# Patient Record
Sex: Male | Born: 2000 | Race: White | Hispanic: No | Marital: Single | Smoking: Never smoker
Health system: Southern US, Community
[De-identification: ages and names within clinical notes are randomized; demographics above are authoritative.]

## PROBLEM LIST (undated history)

## (undated) DIAGNOSIS — F88 Other disorders of psychological development: Secondary | ICD-10-CM

## (undated) DIAGNOSIS — M419 Scoliosis, unspecified: Secondary | ICD-10-CM

## (undated) DIAGNOSIS — F909 Attention-deficit hyperactivity disorder, unspecified type: Secondary | ICD-10-CM

---
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
## 2019-11-06 ENCOUNTER — Other Ambulatory Visit: Payer: Self-pay

## 2019-11-06 ENCOUNTER — Ambulatory Visit
Admission: EM | Admit: 2019-11-06 | Discharge: 2019-11-06 | Disposition: A | Discharge status: Home / Self Care | Payer: Medicaid Other | Attending: Family Medicine | Admitting: Family Medicine

## 2019-11-06 ENCOUNTER — Encounter: Payer: Self-pay | Admitting: Emergency Medicine

## 2019-11-06 DIAGNOSIS — S81812A Laceration without foreign body, left lower leg, initial encounter: Secondary | ICD-10-CM | POA: Diagnosis not present

## 2019-11-06 NOTE — Discharge Instructions (Signed)
You were seen at the Urgent Care for a leg laceration. Take Tylenol or Ibuprofen for pain as needed. Return to urgent care or see your PCP in 7-10 days to have sutures removed.   If you haven't already, sign up for My Chart to have easy access to your labs results, and communication with your primary care physician.  Dr. Rachael Darby

## 2019-11-06 NOTE — ED Provider Notes (Addendum)
[facility]    CSN: [id number] Arrival date & time: 11/06/19  1906      History   Chief Complaint Chief Complaint  Patient presents with  . Laceration    HPI Don Douglas is a 19 y.o. male.   HPI  Patient states he was working with his dad outside and was standing on a pile of truss and his leg when through and was caught on metal. Patient with right leg with lacerations and bleeding. Endorses right leg pain.  Mom wrapped the leg in a towel and secured it with a bungee cord and brought the patient to the ED.    Past Medical History:  Diagnosis Date  . ADHD (attention deficit hyperactivity disorder)   . Sensory integration disorder     There are no problems to display for this patient.   History reviewed. No pertinent surgical history.     Home Medications    Prior to Admission medications   Medication Sig Start Date End Date Taking? Authorizing Provider  methylphenidate (RITALIN) 10 MG tablet Take 10 mg by mouth 2 (two) times daily.    [provider]  montelukast (SINGULAIR) 10 MG tablet Take 10 mg by mouth at bedtime.    [provider]    Family History Family History  Problem Relation Age of Onset  . Healthy Mother   . Healthy Father     Social History Social History   Tobacco Use  . Smoking status: Never Smoker  . Smokeless tobacco: Never Used  Vaping Use  . Vaping Use: Never used  Substance Use Topics  . Alcohol use: No  . Drug use: Not Currently     Allergies   Penicillins   Review of Systems Review of Systems  Gastrointestinal: Negative for nausea and vomiting.  Musculoskeletal:       Leg pain  Skin: Positive for wound.  Neurological: Negative for weakness, light-headedness, numbness and headaches.  All other systems reviewed and are negative.    Physical Exam Triage Vital Signs ED Triage Vitals  Enc Vitals Group     BP 11/06/19 2002 121/77     Pulse Rate 11/06/19 2002 65     Resp 11/06/19  2002 18     Temp 11/06/19 2002 98.4 F (36.9 C)     Temp Source 11/06/19 2002 Oral     SpO2 11/06/19 2002 100 %     Weight 11/06/19 2000 149 lb 14.6 oz (68 kg)     Height 11/06/19 2000 5\' 10"  (1.778 m)     Head Circumference --      Peak Flow --      Pain Score 11/06/19 2000 3     Pain Loc --      Pain Edu? --      Excl. in GC? --    No data found.  Updated Vital Signs BP 121/77 (BP Location: Left Arm)   Pulse 65   Temp 98.4 F (36.9 C) (Oral)   Resp 18   Ht 5\' 10"  (1.778 m)   Wt 149 lb 14.6 oz (68 kg)   SpO2 100%   BMI 21.51 kg/m   Visual Acuity Right Eye Distance:   Left Eye Distance:   Bilateral Distance:    Right Eye Near:   Left Eye Near:    Bilateral Near:     Physical Exam Vitals and nursing note reviewed.  Constitutional:      Appearance: Normal appearance.  HENT:  Head: Normocephalic and atraumatic.     Nose: Nose normal.  Eyes:     Extraocular Movements: Extraocular movements intact.     Conjunctiva/sclera: Conjunctivae normal.  Cardiovascular:     Rate and Rhythm: Normal rate and regular rhythm.     Pulses: Normal pulses.  Pulmonary:     Effort: Pulmonary effort is normal. No respiratory distress.  Musculoskeletal:        General: Tenderness and signs of injury present. Normal range of motion.     Cervical back: Normal range of motion.  Skin:    General: Skin is warm.     Capillary Refill: Capillary refill takes less than 2 seconds.  Neurological:     General: No focal deficit present.     Mental Status: He is alert and oriented to person, place, and time. Mental status is at baseline.     Sensory: No sensory deficit.     Coordination: Coordination normal.  Psychiatric:        Mood and Affect: Mood normal.        Behavior: Behavior normal.      UC Treatments / Results  Labs (all labs ordered are listed, but only abnormal results are displayed) Labs Reviewed - No data to display  EKG   Radiology No results  found.  Procedures Laceration Repair  Date/Time: 11/06/2019 9:38 PM Performed by: Katha Cabal, DO Authorized by: Tommie Sams, DO   Consent:    Consent obtained:  Verbal   Consent given by:  Patient   Risks discussed:  Infection, pain, retained foreign body, poor cosmetic result and poor wound healing   Alternatives discussed:  Delayed treatment Anesthesia (see MAR for exact dosages):    Anesthesia method:  Local infiltration   Local anesthetic:  Lidocaine 1% WITH epi Laceration details:    Location:  Leg   Wound length (cm): medial lac: 4.5 cm, lateral lac 2.5 cm. Repair type:    Repair type:  Simple Pre-procedure details:    Preparation:  Patient was prepped and draped in usual sterile fashion Exploration:    Hemostasis achieved with:  Epinephrine   Wound exploration: entire depth of wound probed and visualized     Wound extent: no fascia violation noted, no foreign bodies/material noted, no muscle damage noted, no nerve damage noted, no tendon damage noted, no underlying fracture noted and no vascular damage noted   Treatment:    Area cleansed with:  Betadine   Amount of cleaning:  Standard   Irrigation solution:  Sterile water   Irrigation method:  Syringe Skin repair:    Repair method:  Sutures   Suture size:  3-0   Suture material:  Prolene   Suture technique:  Simple interrupted Approximation:    Approximation:  Close Post-procedure details:    Dressing:  Antibiotic ointment and non-adherent dressing   (including critical care time)  Medications Ordered in UC Medications - No data to display  Initial Impression / Assessment and Plan / UC Course  I have reviewed the triage vital signs and the nursing notes.  Pertinent labs & imaging results that were available during my care of the patient were reviewed by me and considered in my medical decision making (see chart for details).     Laceration repaired. Patient to return in 7-10 days for suture removal.  Tetanus 12/2017.   Final Clinical Impressions(s) / UC Diagnoses   Final diagnoses:  Laceration of left lower extremity, initial encounter     Discharge  Instructions     You were seen at the Urgent Care for a leg laceration. Take Tylenol or Ibuprofen for pain as needed. Return to urgent care or see your PCP in 7-10 days to have sutures removed.   If you haven't already, sign up for My Chart to have easy access to your labs results, and communication with your primary care physician.  Dr. Rachael Darby      ED Prescriptions    None     PDMP not reviewed this encounter.   Katha Cabal, DO 11/06/19 2149    Katha Cabal, DO 11/06/19 2152

## 2019-11-06 NOTE — ED Triage Notes (Signed)
Pt has a laceration on the right lower calf. He states he was working with his dad and cut his leg on a truss. This occurred about 6 pm today. Last tetanus vaccine was 12/13/2017.

## 2019-11-18 ENCOUNTER — Ambulatory Visit
Admission: EM | Admit: 2019-11-18 | Discharge: 2019-11-18 | Disposition: A | Discharge status: Home / Self Care | Payer: Medicaid Other

## 2019-11-18 DIAGNOSIS — Z4802 Encounter for removal of sutures: Secondary | ICD-10-CM

## 2019-11-18 NOTE — ED Triage Notes (Signed)
Patient in today for suture removal- right calf.

## 2020-04-11 DIAGNOSIS — R002 Palpitations: Secondary | ICD-10-CM | POA: Insufficient documentation

## 2020-04-11 DIAGNOSIS — F419 Anxiety disorder, unspecified: Secondary | ICD-10-CM | POA: Insufficient documentation

## 2020-04-11 DIAGNOSIS — L700 Acne vulgaris: Secondary | ICD-10-CM | POA: Insufficient documentation

## 2020-04-11 DIAGNOSIS — Z0000 Encounter for general adult medical examination without abnormal findings: Secondary | ICD-10-CM | POA: Insufficient documentation

## 2020-09-28 ENCOUNTER — Encounter: Payer: Self-pay | Admitting: Sports Medicine

## 2020-09-28 ENCOUNTER — Other Ambulatory Visit: Payer: Self-pay

## 2020-09-28 ENCOUNTER — Ambulatory Visit
Admission: EM | Admit: 2020-09-28 | Discharge: 2020-09-28 | Disposition: A | Discharge status: Home / Self Care | Payer: Medicaid Other | Attending: Sports Medicine | Admitting: Sports Medicine

## 2020-09-28 DIAGNOSIS — Z8616 Personal history of COVID-19: Secondary | ICD-10-CM | POA: Insufficient documentation

## 2020-09-28 DIAGNOSIS — R110 Nausea: Secondary | ICD-10-CM | POA: Diagnosis not present

## 2020-09-28 DIAGNOSIS — Z880 Allergy status to penicillin: Secondary | ICD-10-CM | POA: Insufficient documentation

## 2020-09-28 DIAGNOSIS — Z20822 Contact with and (suspected) exposure to covid-19: Secondary | ICD-10-CM | POA: Insufficient documentation

## 2020-09-28 DIAGNOSIS — B349 Viral infection, unspecified: Secondary | ICD-10-CM | POA: Diagnosis not present

## 2020-09-28 DIAGNOSIS — J029 Acute pharyngitis, unspecified: Secondary | ICD-10-CM

## 2020-09-28 DIAGNOSIS — Z79899 Other long term (current) drug therapy: Secondary | ICD-10-CM | POA: Insufficient documentation

## 2020-09-28 DIAGNOSIS — R61 Generalized hyperhidrosis: Secondary | ICD-10-CM

## 2020-09-28 DIAGNOSIS — R52 Pain, unspecified: Secondary | ICD-10-CM | POA: Diagnosis not present

## 2020-09-28 DIAGNOSIS — J358 Other chronic diseases of tonsils and adenoids: Secondary | ICD-10-CM

## 2020-09-28 DIAGNOSIS — R519 Headache, unspecified: Secondary | ICD-10-CM | POA: Insufficient documentation

## 2020-09-28 LAB — GROUP A STREP BY PCR: Group A Strep by PCR: NOT DETECTED

## 2020-09-28 LAB — SARS CORONAVIRUS 2 (TAT 6-24 HRS): SARS Coronavirus 2: NEGATIVE

## 2020-09-28 MED ORDER — ONDANSETRON HCL 4 MG PO TABS: 4.0000 mg | ORAL_TABLET | Freq: Four times a day (QID) | ORAL | 0 refills | Status: DC

## 2020-09-28 NOTE — Discharge Instructions (Addendum)
As we discussed, your strep test was negative. Your COVID test was pending at the time of discharge. Supportive care, over-the-counter meds as needed, Tylenol or Motrin for any fever or discomfort.  Plenty of rest and plenty of fluids. I prescribed you a medicine for your nausea. Please follow-up with your primary care provider if your symptoms persist. Please go to the emergency room if your symptoms worsen. I gave you a work note keep you out today but she can go back to work tomorrow if your COVID test is negative.  You can follow along with the app on your phone called MyChart.  Instructions are on your discharge summary.  If you are positive someone will contact you and give you an updated work note.  They may not always contact you if you are negative.

## 2020-09-28 NOTE — ED Triage Notes (Signed)
Pt c/o sore throat since yesterday. Pt also reports body aches, nausea and headache. Pt would like to be tested for strep. Pt denies f/v/d or other s/s.

## 2020-09-28 NOTE — ED Provider Notes (Signed)
[facility]    CSN: [id number] Arrival date & time: 09/28/20  1005      History   Chief Complaint Chief Complaint  Patient presents with   Sore Throat   Generalized Body Aches   Headache    HPI Don Douglas is a 20 y.o. male.   20 year old male who presents for evaluation of URI symptoms.  He reports that yesterday began having a sore throat, body aches, and headache.  He has had no COVID exposure.  He has had COVID back in 2020.  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  No fever shakes chills.  Has not taken any medicine and has not done any treatment.  No cough rhinorrhea or nasal or chest congestion.  No fatigue.  No abdominal or urinary symptoms.  He has a little bit of nausea and has been sweating a little bit more.  No vomiting or diarrhea.  He looked in his throat this morning and noticed some white spots in the back of his throat and was concerned about strep throat.  He denies chest pain shortness of breath.  No red flag signs or symptoms elicited on history.    Past Medical History:  Diagnosis Date   ADHD (attention deficit hyperactivity disorder)    Sensory integration disorder     There are no problems to display for this patient.   History reviewed. No pertinent surgical history.     Home Medications    Prior to Admission medications   Medication Sig Start Date End Date Taking? Authorizing Provider  clindamycin (CLEOCIN T) 1 % external solution Apply topically 2 (two) times daily. 05/31/20  Yes [provider]  hydrOXYzine (ATARAX/VISTARIL) 25 MG tablet Take 25 mg by mouth 3 (three) times daily. 05/31/20  Yes [provider]  ondansetron (ZOFRAN) 4 MG tablet Take 1 tablet (4 mg total) by mouth every 6 (six) hours. 09/28/20  Yes Delton See, MD  methylphenidate (RITALIN) 10 MG tablet Take 10 mg by mouth 2 (two) times daily.    [provider]  montelukast (SINGULAIR) 10 MG tablet Take 10  mg by mouth at bedtime.    [provider]    Family History Family History  Problem Relation Age of Onset   Healthy Mother    Healthy Father     Social History Social History   Tobacco Use   Smoking status: Never   Smokeless tobacco: Never  Vaping Use   Vaping Use: Never used  Substance Use Topics   Alcohol use: No   Drug use: Not Currently     Allergies   Penicillins   Review of Systems Review of Systems  Constitutional:  Negative for activity change, appetite change, chills, diaphoresis, fatigue and fever.  HENT:  Positive for sore throat. Negative for congestion, ear pain, postnasal drip, rhinorrhea, sinus pressure, sinus pain, sneezing and trouble swallowing.   Eyes:  Negative for pain.  Respiratory:  Negative for cough, chest tightness and shortness of breath.   Cardiovascular:  Negative for chest pain and palpitations.  Gastrointestinal:  Negative for abdominal pain, diarrhea, nausea and vomiting.  Genitourinary:  Negative for dysuria.  Musculoskeletal:  Positive for myalgias. Negative for back pain and neck pain.  Skin:  Negative for color change, pallor, rash and wound.  Neurological:  Positive for headaches. Negative for dizziness, light-headedness and numbness.  All other systems reviewed and are negative.   Physical Exam Triage Vital Signs  ED Triage Vitals [09/28/20 1117]  Enc Vitals Group     BP      Pulse      Resp      Temp      Temp src      SpO2      Weight 150 lb (68 kg)     Height 6\' 3"  (1.905 m)     Head Circumference      Peak Flow      Pain Score 10     Pain Loc      Pain Edu?      Excl. in GC?    No data found.  Updated Vital Signs BP 112/82 (BP Location: Left Arm)   Pulse 74   Temp 98.7 F (37.1 C) (Oral)   Resp 18   Ht 6\' 3"  (1.905 m)   Wt 68 kg   SpO2 100%   BMI 18.75 kg/m   Visual Acuity Right Eye Distance:   Left Eye Distance:   Bilateral Distance:    Right Eye Near:   Left Eye Near:    Bilateral  Near:     Physical Exam Vitals and nursing note reviewed.  Constitutional:      General: He is not in acute distress.    Appearance: Normal appearance. He is well-developed. He is not ill-appearing, toxic-appearing or diaphoretic.  HENT:     Head: Normocephalic and atraumatic.     Right Ear: Tympanic membrane normal.     Left Ear: Tympanic membrane normal.     Nose: Nose normal. No congestion or rhinorrhea.     Mouth/Throat:     Mouth: Mucous membranes are moist.     Pharynx: Uvula midline. Oropharyngeal exudate and posterior oropharyngeal erythema present. No pharyngeal swelling or uvula swelling.     Tonsils: Tonsillar exudate present. No tonsillar abscesses. 2+ on the right. 2+ on the left.  Eyes:     Conjunctiva/sclera: Conjunctivae normal.     Pupils: Pupils are equal, round, and reactive to light.  Neck:     Thyroid: No thyromegaly.  Cardiovascular:     Rate and Rhythm: Normal rate and regular rhythm.     Pulses: Normal pulses.     Heart sounds: Normal heart sounds. No murmur heard.   No friction rub. No gallop.  Pulmonary:     Effort: Pulmonary effort is normal.     Breath sounds: Normal breath sounds. No stridor. No wheezing, rhonchi or rales.  Musculoskeletal:     Cervical back: Normal range of motion and neck supple.  Lymphadenopathy:     Cervical: Cervical adenopathy present.  Skin:    General: Skin is warm and dry.     Capillary Refill: Capillary refill takes less than 2 seconds.  Neurological:     General: No focal deficit present.     Mental Status: He is alert and oriented to person, place, and time.     UC Treatments / Results  Labs (all labs ordered are listed, but only abnormal results are displayed) Labs Reviewed  GROUP A STREP BY PCR  SARS CORONAVIRUS 2 (TAT 6-24 HRS)    EKG   Radiology No results found.  Procedures Procedures (including critical care time)  Medications Ordered in UC Medications - No data to display  Initial Impression  / Assessment and Plan / UC Course  I have reviewed the triage vital signs and the nursing notes.  Pertinent labs & imaging results that were available during my care of the  patient were reviewed by me and considered in my medical decision making (see chart for details).  Clinical impression: 1.  Sore throat 2.  Tonsillar exudate 3.  Nausea without vomiting 4.  Headache due to viral illness 5.  Body aches and diaphoresis  Treatment plan: 1.  The findings and treatment plan were discussed in detail with the patient.  Patient was in agreement. 2.  Recommend getting a strep test that was negative. 3.  Recommended getting a COVID test.  It was sent to the hospital.  Informed the patient that while he was waiting on the test he needed to isolate.  Should have the test results back later today.  If he is positive someone will contact him and transition him into isolation per the current CDC guidelines.  Encouraged him to follow along with the app on his phone called MyChart. 4.  Provided a work note keep him out today and that can be updated if necessary. 5.  Plenty of rest, plenty fluids, Tylenol or Motrin for any fever or discomfort. 6.  Did prescribe some Zofran for his nausea. 7.  While he had a little bit of tonsillar hypertrophy I did not prescribe steroids.  We did discuss doing a monotest but he is not having any fever, fatigue, or abdominal pain.  We will hold on that for now.  May be something we need to look at in the future. 8.  If symptoms persist he should see his primary care provider. 9.  If it worsens in any way he should go to the ER. 10.  He was discharged in stable condition will follow-up here as needed.    Final Clinical Impressions(s) / UC Diagnoses   Final diagnoses:  Pharyngitis, unspecified etiology  Body aches  Headache due to viral infection  Diaphoresis  Nausea without vomiting  Tonsillar exudate     Discharge Instructions      As we discussed, your strep  test was negative. Your COVID test was pending at the time of discharge. Supportive care, over-the-counter meds as needed, Tylenol or Motrin for any fever or discomfort.  Plenty of rest and plenty of fluids. I prescribed you a medicine for your nausea. Please follow-up with your primary care provider if your symptoms persist. Please go to the emergency room if your symptoms worsen. I gave you a work note keep you out today but she can go back to work tomorrow if your COVID test is negative.  You can follow along with the app on your phone called MyChart.  Instructions are on your discharge summary.  If you are positive someone will contact you and give you an updated work note.  They may not always contact you if you are negative.     ED Prescriptions     Medication Sig Dispense Auth. Provider   ondansetron (ZOFRAN) 4 MG tablet Take 1 tablet (4 mg total) by mouth every 6 (six) hours. 12 tablet Delton See, MD      PDMP not reviewed this encounter.   Delton See, MD 09/30/20 [phone]

## 2021-02-11 ENCOUNTER — Other Ambulatory Visit: Payer: Self-pay

## 2021-02-11 ENCOUNTER — Ambulatory Visit
Admission: RE | Admit: 2021-02-11 | Discharge: 2021-02-11 | Disposition: A | Discharge status: Home / Self Care | Payer: Medicaid Other | Source: Ambulatory Visit | Attending: Physician Assistant | Admitting: Physician Assistant

## 2021-02-11 VITALS — BP 126/72 | HR 68 | Temp 98.4°F | Resp 18 | Ht 75.0 in | Wt 149.9 lb

## 2021-02-11 DIAGNOSIS — Z202 Contact with and (suspected) exposure to infections with a predominantly sexual mode of transmission: Secondary | ICD-10-CM | POA: Diagnosis not present

## 2021-02-11 DIAGNOSIS — Z113 Encounter for screening for infections with a predominantly sexual mode of transmission: Secondary | ICD-10-CM | POA: Diagnosis present

## 2021-02-11 LAB — HIV ANTIBODY (ROUTINE TESTING W REFLEX): HIV Screen 4th Generation wRfx: NONREACTIVE

## 2021-02-11 NOTE — Discharge Instructions (Signed)
-  You have been tested for gonorrhea, chlamydia, trichomonas, HIV and syphilis.  Results will be back tomorrow we will call you if anything is positive and recommend treatment.

## 2021-02-11 NOTE — ED Triage Notes (Signed)
Pt presents to [facility] for std testing. He states he found out his ex was cheating and pt wants to be sure he is ok. Denies symptoms.

## 2021-02-11 NOTE — ED Provider Notes (Signed)
[facility]    CSN: [id number] Arrival date & time: 02/11/21  1707      History   Chief Complaint Chief Complaint  Patient presents with   Appointment   Exposure to STD    HPI Don Douglas is a 20 y.o. male presenting for STI screening.  Patient denies any symptoms.  Denies dysuria, urinary urgency, abdominal pain or pelvic pain, skin rashes or lesions, urethral discharge or testicular pain.  Patient says he recently found out that his ex-girlfriend cheated on him for a while.  He is unsure if she has had any symptoms but wanted to make sure she did not give him any STIs.  HPI  Past Medical History:  Diagnosis Date   ADHD (attention deficit hyperactivity disorder)    Sensory integration disorder     There are no problems to display for this patient.   History reviewed. No pertinent surgical history.     Home Medications    Prior to Admission medications   Medication Sig Start Date End Date Taking? Authorizing Provider  clindamycin (CLEOCIN T) 1 % external solution Apply topically 2 (two) times daily. 05/31/20   [provider]  hydrOXYzine (ATARAX/VISTARIL) 25 MG tablet Take 25 mg by mouth 3 (three) times daily. 05/31/20   [provider]  methylphenidate (RITALIN) 10 MG tablet Take 10 mg by mouth 2 (two) times daily.    [provider]  montelukast (SINGULAIR) 10 MG tablet Take 10 mg by mouth at bedtime.    [provider]  ondansetron (ZOFRAN) 4 MG tablet Take 1 tablet (4 mg total) by mouth every 6 (six) hours. 09/28/20   Delton See, MD    Family History Family History  Problem Relation Age of Onset   Healthy Mother    Healthy Father     Social History Social History   Tobacco Use   Smoking status: Never   Smokeless tobacco: Never  Vaping Use   Vaping Use: Never used  Substance Use Topics   Alcohol use: Yes   Drug use: Yes    Types: Marijuana     Allergies   Penicillins   Review of  Systems Review of Systems  Constitutional:  Negative for fatigue and fever.  Eyes:  Negative for discharge.  Gastrointestinal:  Negative for abdominal pain, nausea and vomiting.  Genitourinary:  Negative for dysuria, frequency, genital sores, hematuria, penile discharge, penile pain, penile swelling, scrotal swelling, testicular pain and urgency.  Musculoskeletal:  Negative for arthralgias.  Skin:  Negative for rash.  Neurological:  Negative for weakness.    Physical Exam Triage Vital Signs ED Triage Vitals  Enc Vitals Group     BP 02/11/21 1750 126/72     Pulse Rate 02/11/21 1750 68     Resp 02/11/21 1750 18     Temp 02/11/21 1750 98.4 F (36.9 C)     Temp Source 02/11/21 1750 Oral     SpO2 02/11/21 1750 100 %     Weight 02/11/21 1747 149 lb 14.6 oz (68 kg)     Height 02/11/21 1747 6\' 3"  (1.905 m)     Head Circumference --      Peak Flow --      Pain Score 02/11/21 1747 0     Pain Loc --      Pain Edu? --      Excl. in GC? --    No data found.  Updated Vital Signs BP 126/72 (BP Location: Left Arm)  Pulse 68   Temp 98.4 F (36.9 C) (Oral)   Resp 18   Ht 6\' 3"  (1.905 m)   Wt 149 lb 14.6 oz (68 kg)   SpO2 100%   BMI 18.74 kg/m      Physical Exam Vitals and nursing note reviewed.  Constitutional:      General: He is not in acute distress.    Appearance: Normal appearance. He is well-developed. He is not ill-appearing.  HENT:     Head: Normocephalic and atraumatic.  Eyes:     General: No scleral icterus.    Conjunctiva/sclera: Conjunctivae normal.  Cardiovascular:     Rate and Rhythm: Normal rate and regular rhythm.  Pulmonary:     Effort: Pulmonary effort is normal. No respiratory distress.  Musculoskeletal:     Cervical back: Neck supple.  Skin:    General: Skin is warm and dry.     Capillary Refill: Capillary refill takes less than 2 seconds.  Neurological:     General: No focal deficit present.     Mental Status: He is alert. Mental status is at  baseline.     Motor: No weakness.     Coordination: Coordination normal.     Gait: Gait normal.  Psychiatric:        Mood and Affect: Mood normal.        Behavior: Behavior normal.        Thought Content: Thought content normal.     UC Treatments / Results  Labs (all labs ordered are listed, but only abnormal results are displayed) Labs Reviewed  HIV ANTIBODY (ROUTINE TESTING W REFLEX)  RPR  CERVICOVAGINAL ANCILLARY ONLY    EKG   Radiology No results found.  Procedures Procedures (including critical care time)  Medications Ordered in UC Medications - No data to display  Initial Impression / Assessment and Plan / UC Course  I have reviewed the triage vital signs and the nursing notes.  Pertinent labs & imaging results that were available during my care of the patient were reviewed by me and considered in my medical decision making (see chart for details).    20 year old male presenting for STI screening.  Denies any symptoms.  Reports his previous partner was unfaithful and he is concerned about possibility of an STI.  Would like testing for gonorrhea, chlamydia, trichomonas, HIV and syphilis.  Patient elects to perform a urethral self swab for gonorrhea, chlamydia and trichomonas.  Explained to patient how to perform this.  Advised patient how to access all lab results and explained to him that if anything is positive we will contact him and recommend further treatment.   Final Clinical Impressions(s) / UC Diagnoses   Final diagnoses:  Screen for STD (sexually transmitted disease)     Discharge Instructions      -You have been tested for gonorrhea, chlamydia, trichomonas, HIV and syphilis.  Results will be back tomorrow we will call you if anything is positive and recommend treatment.   ED Prescriptions   None    PDMP not reviewed this encounter.   Danton Clap, PA-C 02/11/21 1819

## 2021-02-12 LAB — RPR: RPR Ser Ql: NONREACTIVE

## 2021-02-14 LAB — CERVICOVAGINAL ANCILLARY ONLY
Chlamydia: NEGATIVE
Comment: NEGATIVE
Comment: NEGATIVE
Comment: NORMAL
Neisseria Gonorrhea: NEGATIVE
Trichomonas: NEGATIVE

## 2021-04-14 ENCOUNTER — Ambulatory Visit
Admission: EM | Admit: 2021-04-14 | Discharge: 2021-04-14 | Disposition: A | Discharge status: Home / Self Care | Payer: Medicaid Other | Attending: Medical Oncology | Admitting: Medical Oncology

## 2021-04-14 ENCOUNTER — Ambulatory Visit: Payer: Medicaid Other

## 2021-04-14 ENCOUNTER — Encounter: Payer: Self-pay | Admitting: Emergency Medicine

## 2021-04-14 ENCOUNTER — Other Ambulatory Visit: Payer: Self-pay

## 2021-04-14 ENCOUNTER — Ambulatory Visit (INDEPENDENT_AMBULATORY_CARE_PROVIDER_SITE_OTHER): Payer: Medicaid Other

## 2021-04-14 DIAGNOSIS — M25561 Pain in right knee: Secondary | ICD-10-CM

## 2021-04-14 MED ORDER — MELOXICAM 15 MG PO TABS: 15.0000 mg | ORAL_TABLET | Freq: Every day | ORAL | 0 refills | Status: DC

## 2021-04-14 NOTE — ED Triage Notes (Signed)
Pt c/o right knee pain. Started about 2 weeks ago. He states about a week ago he noticed swelling. No know injury.

## 2021-04-14 NOTE — ED Provider Notes (Signed)
[facility]    CSN: [id number] Arrival date & time: 04/14/21  1527      History   Chief Complaint Chief Complaint  Patient presents with   Knee Pain    right    HPI Don Douglas is a 21 y.o. male.   HPI  Knee Pain: Pt states that over the past 2 weeks he has had progressive right sided knee pain and swelling. He denies any injury however he has been playing a lot of basketball compared to his normal lately and does a lot of dunking and squatting. He has had some swelling. No bruising, lower leg swelling or weakness. He has tried ICE with some relief. Requests an x ray.   Past Medical History:  Diagnosis Date   ADHD (attention deficit hyperactivity disorder)    Sensory integration disorder     There are no problems to display for this patient.   History reviewed. No pertinent surgical history.   Home Medications    Prior to Admission medications   Medication Sig Start Date End Date Taking? Authorizing Provider  clindamycin (CLEOCIN T) 1 % external solution Apply topically 2 (two) times daily. 05/31/20   [provider]  hydrOXYzine (ATARAX/VISTARIL) 25 MG tablet Take 25 mg by mouth 3 (three) times daily. 05/31/20   [provider]  methylphenidate (RITALIN) 10 MG tablet Take 10 mg by mouth 2 (two) times daily.    [provider]  montelukast (SINGULAIR) 10 MG tablet Take 10 mg by mouth at bedtime.    [provider]  ondansetron (ZOFRAN) 4 MG tablet Take 1 tablet (4 mg total) by mouth every 6 (six) hours. 09/28/20   Verda Cumins, MD    Family History Family History  Problem Relation Age of Onset   Healthy Mother    Healthy Father     Social History Social History   Tobacco Use   Smoking status: Never   Smokeless tobacco: Never  Vaping Use   Vaping Use: Never used  Substance Use Topics   Alcohol use: Yes   Drug use: Yes    Types: Marijuana     Allergies   Penicillins   Review of Systems Review of  Systems  As stated above in HPI Physical Exam Triage Vital Signs ED Triage Vitals  Enc Vitals Group     BP 04/14/21 1542 (!) 142/79     Pulse Rate 04/14/21 1542 85     Resp 04/14/21 1542 18     Temp 04/14/21 1542 98.7 F (37.1 C)     Temp Source 04/14/21 1542 Oral     SpO2 04/14/21 1542 98 %     Weight 04/14/21 1541 149 lb 14.6 oz (68 kg)     Height 04/14/21 1541 6\' 3"  (1.905 m)     Head Circumference --      Peak Flow --      Pain Score 04/14/21 1539 9     Pain Loc --      Pain Edu? --      Excl. in [place]? --    No data found.  Updated Vital Signs BP (!) 142/79 (BP Location: Right Arm)    Pulse 85    Temp 98.7 F (37.1 C) (Oral)    Resp 18    Ht 6\' 3"  (1.905 m)    Wt 149 lb 14.6 oz (68 kg)    SpO2 98%    BMI 18.74 kg/m   Physical Exam Vitals and nursing  note reviewed.  Constitutional:      General: He is not in acute distress.    Appearance: Normal appearance. He is not ill-appearing, toxic-appearing or diaphoretic.  Cardiovascular:     Pulses: Normal pulses.  Musculoskeletal:        General: Swelling (mild right knee throughout.) present. No tenderness or deformity. Normal range of motion.     Right knee: Effusion present. No swelling, deformity, erythema, ecchymosis, lacerations, bony tenderness or crepitus. Normal range of motion. No tenderness. No LCL laxity, MCL laxity, ACL laxity or PCL laxity. Normal alignment, normal meniscus and normal patellar mobility. Normal pulse.     Instability Tests: Anterior drawer test negative. Posterior drawer test negative. Anterior Lachman test negative. Medial McMurray test negative and lateral McMurray test negative.     Left knee: Normal.  Neurological:     Mental Status: He is alert.     UC Treatments / Results  Labs (all labs ordered are listed, but only abnormal results are displayed) Labs Reviewed - No data to display  EKG   Radiology No results found.  Procedures Procedures (including critical care  time)  Medications Ordered in UC Medications - No data to display  Initial Impression / Assessment and Plan / UC Course  I have reviewed the triage vital signs and the nursing notes.  Pertinent labs & imaging results that were available during my care of the patient were reviewed by me and considered in my medical decision making (see chart for details).     New. X ray shows no acute abnormality. Treating with RICE, patellar knee brace, Mobic, activity modification for 2 weeks. If unimproved orthopedics follow up recommended.   Final Clinical Impressions(s) / UC Diagnoses   Final diagnoses:  None   Discharge Instructions   None    ED Prescriptions   None    PDMP not reviewed this encounter.   Hughie Closs, [place] 04/14/21 1616

## 2021-05-02 ENCOUNTER — Encounter: Payer: Self-pay | Admitting: Emergency Medicine

## 2021-05-02 ENCOUNTER — Other Ambulatory Visit: Payer: Self-pay

## 2021-05-02 ENCOUNTER — Ambulatory Visit
Admission: EM | Admit: 2021-05-02 | Discharge: 2021-05-02 | Disposition: A | Discharge status: Home / Self Care | Payer: Medicaid Other | Attending: Emergency Medicine | Admitting: Emergency Medicine

## 2021-05-02 ENCOUNTER — Ambulatory Visit (INDEPENDENT_AMBULATORY_CARE_PROVIDER_SITE_OTHER): Payer: Medicaid Other

## 2021-05-02 DIAGNOSIS — M25512 Pain in left shoulder: Secondary | ICD-10-CM | POA: Diagnosis not present

## 2021-05-02 MED ORDER — CYCLOBENZAPRINE HCL 5 MG PO TABS: 5.0000 mg | ORAL_TABLET | Freq: Every day | ORAL | 0 refills | Status: DC

## 2021-05-02 MED ORDER — NAPROXEN 250 MG PO TABS: 500.0000 mg | ORAL_TABLET | Freq: Two times a day (BID) | ORAL | 0 refills | Status: AC

## 2021-05-02 NOTE — Discharge Instructions (Addendum)
X-ray negative for injury to the bone  Your pain is most likely caused by irritation to the muscles or ligaments.   Take naproxen 500 mg( 2 tablets) twice daily for the next 7 days then you may use as needed  You may use muscle relaxer at bedtime for additional comfort  You may use heating pad in 15 minute intervals as needed for additional comfort or you may find comfort in using ice in 10-15 minutes over affected area  Begin stretching affected area daily for 10 minutes as tolerated to further loosen muscles   When lying down place pillow underneath arm and behind back for support   If pain persist after recommended treatment or reoccurs if may be beneficial to follow up with orthopedic specialist for evaluation, this doctor specializes in the bones and can manage your symptoms long-term with options such as but not limited to imaging, medications or physical therapy

## 2021-05-02 NOTE — ED Provider Notes (Signed)
Don Douglas    CSN: [id number] Arrival date & time: 05/02/21  1213      History   Chief Complaint Chief Complaint  Patient presents with   Shoulder Pain    left    HPI Don Douglas is a 21 y.o. male.   Patient presents with left shoulder pain for 3 days starting abruptly without precipitating event, injury or trauma.  Symptoms worsened this morning. Pain is noted to be lateral.  Painful to lift arm above head and with lifting objects.   Endorses that he does play basketball frequently .  History of a left shoulder dislocation with a clavicle fracture.  Denies numbness, tingling.  Has not attempted treatment of symptoms.    Past Medical History:  Diagnosis Date   ADHD (attention deficit hyperactivity disorder)    Sensory integration disorder     There are no problems to display for this patient.   History reviewed. No pertinent surgical history.     Home Medications    Prior to Admission medications   Medication Sig Start Date End Date Taking? Authorizing Provider  clindamycin (CLEOCIN T) 1 % external solution Apply topically 2 (two) times daily. 05/31/20   [provider]  hydrOXYzine (ATARAX/VISTARIL) 25 MG tablet Take 25 mg by mouth 3 (three) times daily. 05/31/20   [provider]  meloxicam (MOBIC) 15 MG tablet Take 1 tablet (15 mg total) by mouth daily. 04/14/21   Rushie Chestnut, PA-C  methylphenidate (RITALIN) 10 MG tablet Take 10 mg by mouth 2 (two) times daily.    [provider]  montelukast (SINGULAIR) 10 MG tablet Take 10 mg by mouth at bedtime.    [provider]  ondansetron (ZOFRAN) 4 MG tablet Take 1 tablet (4 mg total) by mouth every 6 (six) hours. 09/28/20   Delton See, MD    Family History Family History  Problem Relation Age of Onset   Healthy Mother    Healthy Father     Social History Social History   Tobacco Use   Smoking status: Never   Smokeless tobacco: Never  Vaping Use    Vaping Use: Never used  Substance Use Topics   Alcohol use: Yes   Drug use: Yes    Types: Marijuana     Allergies   Penicillins   Review of Systems Review of Systems  Constitutional: Negative.   Respiratory: Negative.    Cardiovascular: Negative.   Skin: Negative.   Neurological: Negative.     Physical Exam Triage Vital Signs ED Triage Vitals  Enc Vitals Group     BP 05/02/21 1314 121/73     Pulse Rate 05/02/21 1314 63     Resp 05/02/21 1314 18     Temp 05/02/21 1314 98.3 F (36.8 C)     Temp Source 05/02/21 1314 Oral     SpO2 05/02/21 1314 99 %     Weight 05/02/21 1312 149 lb 14.6 oz (68 kg)     Height 05/02/21 1312 6\' 3"  (1.905 m)     Head Circumference --      Peak Flow --      Pain Score 05/02/21 1311 10     Pain Loc --      Pain Edu? --      Excl. in [place]? --    No data found.  Updated Vital Signs BP 121/73 (BP Location: Left Arm)    Pulse 63    Temp 98.3 F (36.8 C) (Oral)  Resp 18    Ht 6\' 3"  (1.905 m)    Wt 149 lb 14.6 oz (68 kg)    SpO2 99%    BMI 18.74 kg/m   Visual Acuity Right Eye Distance:   Left Eye Distance:   Bilateral Distance:    Right Eye Near:   Left Eye Near:    Bilateral Near:     Physical Exam Constitutional:      Appearance: Normal appearance.  HENT:     Head: Normocephalic.  Eyes:     Extraocular Movements: Extraocular movements intact.  Pulmonary:     Effort: Pulmonary effort is normal.  Musculoskeletal:     Comments: Tenderness noted to the anterior lateral aspect of the left shoulder, mild swelling noted anteriorly, limited range of motion due to the pain, negative Hawkins sign  Skin:    General: Skin is warm and dry.  Neurological:     Mental Status: He is alert and oriented to person, place, and time. Mental status is at baseline.  Psychiatric:        Mood and Affect: Mood normal.        Behavior: Behavior normal.     UC Treatments / Results  Labs (all labs ordered are listed, but only abnormal results are  displayed) Labs Reviewed - No data to display  EKG   Radiology No results found.  Procedures Procedures (including critical Douglas time)  Medications Ordered in UC Medications - No data to display  Initial Impression / Assessment and Plan / UC Course  I have reviewed the triage vital signs and the nursing notes.  Pertinent labs & imaging results that were available during my Douglas of the patient were reviewed by me and considered in my medical decision making (see chart for details).  Acute left shoulder pain  X-ray negative for fracture, discussed findings with patient, prescribed him Peroxin 500 mg twice daily for 7 days Flexeril 5 mg as needed at bedtime, recommended RICE, heat as tolerated, daily stretching and pillows for support and activity as tolerated, given work note, recommended that if symptoms continue to persist after 1 week of persistent medication use patient to follow-up with orthopedics, given walking referral to 2 practices Final Clinical Impressions(s) / UC Diagnoses   Final diagnoses:  None   Discharge Instructions   None    ED Prescriptions   None    PDMP not reviewed this encounter.   , NP 05/02/21 1411

## 2021-05-02 NOTE — ED Triage Notes (Signed)
Pt c/o left shoulder pain. Started about 3 days ago. He states it hurts worse to lift his arm/shoulder. No known injury.

## 2022-06-01 ENCOUNTER — Ambulatory Visit (INDEPENDENT_AMBULATORY_CARE_PROVIDER_SITE_OTHER): Payer: Medicaid Other

## 2022-06-01 ENCOUNTER — Ambulatory Visit
Admission: EM | Admit: 2022-06-01 | Discharge: 2022-06-01 | Disposition: A | Discharge status: Home / Self Care | Payer: Medicaid Other | Attending: Emergency Medicine | Admitting: Emergency Medicine

## 2022-06-01 DIAGNOSIS — R0789 Other chest pain: Secondary | ICD-10-CM

## 2022-06-01 MED ORDER — HYDROXYZINE HCL 25 MG PO TABS: 25.0000 mg | ORAL_TABLET | Freq: Four times a day (QID) | ORAL | 0 refills | Status: DC

## 2022-06-01 MED ORDER — BACLOFEN 10 MG PO TABS: 10.0000 mg | ORAL_TABLET | Freq: Three times a day (TID) | ORAL | 0 refills | Status: DC

## 2022-06-01 MED ORDER — IBUPROFEN 600 MG PO TABS: 600.0000 mg | ORAL_TABLET | Freq: Four times a day (QID) | ORAL | 0 refills | Status: DC | PRN

## 2022-06-01 NOTE — ED Triage Notes (Addendum)
Pt c/o chest pain, that occurred today states it woke him up from his sleep along w/HA & SOB at rest.

## 2022-06-01 NOTE — ED Provider Notes (Signed)
[facility]    CSN: [id number] Arrival date & time: 06/01/22  1827      History   Chief Complaint Chief Complaint  Patient presents with   Chest Pain    HPI TAFARI CHUNG is a 22 y.o. male.   HPI  22 year old male with a past medical history significant for anxiety, ADD, palpitations, and adjustment disorder presents for evaluation of chest pain, headache, and shortness of breath at rest.  He states that the pain was there when he woke up this morning and has been waxing and waning all day but has been present.  It is worse with deep breathing.  He also notices pain when stretching.  He denies any fever, cough, feeling of faint, runny nose, or history of similar.  He denies any injury.  Past Medical History:  Diagnosis Date   ADHD (attention deficit hyperactivity disorder)    Sensory integration disorder     Patient Active Problem List   Diagnosis Date Noted   Acne vulgaris 04/11/2020   Annual physical exam 04/11/2020   Anxiety 04/11/2020   Palpitations 04/11/2020   Chronic pain of right knee 04/15/2018   Abnormal vision screen 12/17/2017   Adjustment disorder 03/10/2011   Attention deficit disorder 09/21/2010    History reviewed. No pertinent surgical history.     Home Medications    Prior to Admission medications   Medication Sig Start Date End Date Taking? Authorizing Provider  baclofen (LIORESAL) 10 MG tablet Take 1 tablet (10 mg total) by mouth 3 (three) times daily. 06/01/22  Yes Margarette Canada, NP  hydrOXYzine (ATARAX) 25 MG tablet Take 1 tablet (25 mg total) by mouth every 6 (six) hours. 06/01/22  Yes Margarette Canada, NP  ibuprofen (ADVIL) 600 MG tablet Take 1 tablet (600 mg total) by mouth every 6 (six) hours as needed. 06/01/22  Yes Margarette Canada, NP    Family History Family History  Problem Relation Age of Onset   Healthy Mother    Healthy Father     Social History Social History   Tobacco Use   Smoking status: Never   Smokeless  tobacco: Never  Vaping Use   Vaping Use: Never used  Substance Use Topics   Alcohol use: Yes   Drug use: Yes    Types: Marijuana     Allergies   Penicillins   Review of Systems Review of Systems  Respiratory:  Positive for shortness of breath. Negative for cough and wheezing.   Cardiovascular:  Positive for chest pain.  Neurological:  Negative for dizziness and syncope.     Physical Exam Triage Vital Signs ED Triage Vitals [06/01/22 1834]  Enc Vitals Group     BP      Pulse      Resp      Temp      Temp src      SpO2      Weight 160 lb (72.6 kg)     Height 6\' 4"  (1.93 m)     Head Circumference      Peak Flow      Pain Score 7     Pain Loc      Pain Edu?      Excl. in [place]?    No data found.  Updated Vital Signs BP 125/72 (BP Location: Left Arm)   Pulse 71   Temp 98.7 F (37.1 C) (Oral)   Resp 16   Ht 6\' 4"  (1.93 m)   Wt 160 lb (  72.6 kg)   SpO2 98%   BMI 19.48 kg/m   Visual Acuity Right Eye Distance:   Left Eye Distance:   Bilateral Distance:    Right Eye Near:   Left Eye Near:    Bilateral Near:     Physical Exam Vitals and nursing note reviewed.  Constitutional:      Appearance: Normal appearance. He is not ill-appearing.  HENT:     Head: Normocephalic and atraumatic.  Cardiovascular:     Rate and Rhythm: Normal rate and regular rhythm.     Pulses: Normal pulses.     Heart sounds: Normal heart sounds. No murmur heard.    No friction rub. No gallop.  Pulmonary:     Effort: Pulmonary effort is normal.     Breath sounds: Normal breath sounds. No wheezing, rhonchi or rales.  Chest:     Chest wall: No tenderness.  Skin:    General: Skin is warm and dry.     Capillary Refill: Capillary refill takes less than 2 seconds.     Findings: No erythema or rash.  Neurological:     Mental Status: He is alert.      UC Treatments / Results  Labs (all labs ordered are listed, but only abnormal results are displayed) Labs Reviewed - No data to  display  EKG Normal sinus rhythm with a ventricular rate of 61 bpm PR interval 152 ms QRS duration 98 ms QT/QTc 400/402 ms No ST or T wave abnormalities noted.  Radiology DG Chest 2 View  Result Date: 06/01/2022 CLINICAL DATA:  Chest pain. EXAM: CHEST - 2 VIEW COMPARISON:  Chest radiograph 05/31/2005 FINDINGS: The heart size and mediastinal contours are within normal limits. Both lungs are clear. No pleural effusion or pneumothorax. The visualized skeletal structures are unremarkable. IMPRESSION: No active cardiopulmonary disease. Electronically Signed   By: Ileana Roup M.D.   On: 06/01/2022 19:21    Procedures Procedures (including critical care time)  Medications Ordered in UC Medications - No data to display  Initial Impression / Assessment and Plan / UC Course  I have reviewed the triage vital signs and the nursing notes.  Pertinent labs & imaging results that were available during my care of the patient were reviewed by me and considered in my medical decision making (see chart for details).   Patient is a nontoxic-appearing 22 year old male here for evaluation of central chest pain with associated shortness of breath both with activity and at rest, and headache.  He reports that he was recently in [place] on vacation and he had a panic attack while he was there at the beach.  He has had palpitations in the past and also has a history of anxiety but he states that this feels different.  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  This morning they did not have work because it was raining so he went back to bed and woke up again at 8 which is when the chest pain started.  He also reports that his heart "felt funny".  His mother has a valve issue but the patient reports that he has had 2 echocardiograms that were negative.  He does endorse an increase of pain with both deep breathing and also when he relaxes after stretching his chest he  feels the pain.  It is not reproducible to palpation on exam however.  His lung sounds are clear to auscultation all fields.  He does not smoke and drinks alcohol rarely.  He is active and plays basketball frequently.  No significant increases in job or relationship stress.  Differential diagnosis include musculoskeletal chest wall pain, anxiety, and less likely ACS.  I will order an EKG to evaluate the electrical activity of the patient's heart and a chest x-ray to look for any cardiopulmonary abnormalities.  Patient's EKG shows normal sinus rhythm without any T wave or ST abnormalities.  There are no other tracings available for comparison in epic.  Chest x-ray independently reviewed and evaluated by me.  Impression: There is no infiltrate or effusion appreciated on exam.  Both lung fields are well aerated.  Radiology overread is pending. Radiology impression states no active cardiopulmonary disease.  I believe that the patient's chest pain has multiple factors to include both anxiety as well as musculoskeletal chest wall pain.  Especially considering the pain is worse with deep breathing or with stretching of the chest wall.  I will treat the musculoskeletal chest wall pain with ibuprofen and baclofen to help with inflammation and muscle spasm.  The patient's anxiety I will prescribe hydroxyzine that he can use as an abortive when he feels like his heart is racing and he is having palpitations.  I am also going to encourage him to find a therapist to help him work through his anxiety and develop coping strategies.   Final Clinical Impressions(s) / UC Diagnoses   Final diagnoses:  Chest wall pain     Discharge Instructions      Your EKG was completely normal and did not show any abnormalities to the electrical activity which would reflect damage to your heart muscle or a heart cause of your chest pain.  Your chest x-ray was also clear and did not demonstrate any pneumonia, masses, or bony  abnormalities.  I believe your chest pain is multifactorial and part of it is related to musculoskeletal given that you have increased pain with deep breathing and also with some movement.  Another portion I believe is also anxiety related.  For the musculoskeletal portion please use the ibuprofen 600 mg every 6 hours with food and the baclofen 10 mg every 8 hours as needed for pain, inflammation, and muscle tension.  For your anxiety I am going to prescribe you some hydroxyzine that you can use on an as-needed basis.  If you start to feel tightness in your chest along with palpitations you can try taking 1/2 to 1 tablet to see if this helps your symptoms.  I also recommend that you find a therapist to help you navigate your anxiety triggers and come up with other coping mechanisms that might be helpful to prevent further episodes.  Please return for reevaluation for any new or worsening symptoms.     ED Prescriptions     Medication Sig Dispense Auth. Provider   ibuprofen (ADVIL) 600 MG tablet Take 1 tablet (600 mg total) by mouth every 6 (six) hours as needed. 30 tablet Margarette Canada, NP   baclofen (LIORESAL) 10 MG tablet Take 1 tablet (10 mg total) by mouth 3 (three) times daily. 30 each Margarette Canada, NP   hydrOXYzine (ATARAX) 25 MG tablet Take 1 tablet (25 mg total) by mouth every 6 (six) hours. 12 tablet Margarette Canada, NP      PDMP not reviewed this encounter.   Margarette Canada, NP 06/01/22 [phone]

## 2022-06-01 NOTE — Discharge Instructions (Addendum)
Your EKG was completely normal and did not show any abnormalities to the electrical activity which would reflect damage to your heart muscle or a heart cause of your chest pain.  Your chest x-ray was also clear and did not demonstrate any pneumonia, masses, or bony abnormalities.  I believe your chest pain is multifactorial and part of it is related to musculoskeletal given that you have increased pain with deep breathing and also with some movement.  Another portion I believe is also anxiety related.  For the musculoskeletal portion please use the ibuprofen 600 mg every 6 hours with food and the baclofen 10 mg every 8 hours as needed for pain, inflammation, and muscle tension.  For your anxiety I am going to prescribe you some hydroxyzine that you can use on an as-needed basis.  If you start to feel tightness in your chest along with palpitations you can try taking 1/2 to 1 tablet to see if this helps your symptoms.  I also recommend that you find a therapist to help you navigate your anxiety triggers and come up with other coping mechanisms that might be helpful to prevent further episodes.  Please return for reevaluation for any new or worsening symptoms.

## 2022-07-03 ENCOUNTER — Ambulatory Visit
Admission: EM | Admit: 2022-07-03 | Discharge: 2022-07-03 | Disposition: A | Discharge status: Home / Self Care | Payer: Medicaid Other | Attending: Emergency Medicine | Admitting: Emergency Medicine

## 2022-07-03 DIAGNOSIS — M25561 Pain in right knee: Secondary | ICD-10-CM | POA: Diagnosis not present

## 2022-07-03 MED ORDER — MELOXICAM 7.5 MG PO TABS: 7.5000 mg | ORAL_TABLET | Freq: Every day | ORAL | 0 refills | Status: DC

## 2022-07-03 NOTE — ED Provider Notes (Signed)
[facility]    CSN: [id number] Arrival date & time: 07/03/22  1550      History   Chief Complaint Chief Complaint  Patient presents with   Knee Pain    HPI Don Douglas is a 22 y.o. male.   HPI  22 year old male with a past medical history significant for ADHD, adjustment disorder, and scoliosis presents for evaluation of 1 year worth of pain in his right knee.  He was evaluated in this urgent care in February 2023 and was told that he may have a tear to his meniscus.  He reports that in the intervening time he has pain when he goes up and down stairs but he denies his knee giving out or locking up.  Yesterday he felt and heard a pop in his knee and he has had mild increase in pain ever since.  He denies any swelling.  He was recommended to follow-up with orthopedics if his symptoms continued but he has not done so.  Past Medical History:  Diagnosis Date   ADHD (attention deficit hyperactivity disorder)    Sensory integration disorder     Patient Active Problem List   Diagnosis Date Noted   Acne vulgaris 04/11/2020   Annual physical exam 04/11/2020   Anxiety 04/11/2020   Palpitations 04/11/2020   Chronic pain of right knee 04/15/2018   Abnormal vision screen 12/17/2017   Adjustment disorder 03/10/2011   Attention deficit disorder 09/21/2010    History reviewed. No pertinent surgical history.     Home Medications    Prior to Admission medications   Medication Sig Start Date End Date Taking? Authorizing Provider  meloxicam (MOBIC) 7.5 MG tablet Take 1 tablet (7.5 mg total) by mouth daily. 07/03/22  Yes Becky Augusta, NP    Family History Family History  Problem Relation Age of Onset   Healthy Mother    Healthy Father     Social History Social History   Tobacco Use   Smoking status: Never   Smokeless tobacco: Never  Vaping Use   Vaping Use: Never used  Substance Use Topics   Alcohol use: Yes   Drug use: Yes    Types: Marijuana      Allergies   Penicillins   Review of Systems Review of Systems  Musculoskeletal:  Positive for arthralgias. Negative for joint swelling.  Neurological:  Negative for numbness.     Physical Exam Triage Vital Signs ED Triage Vitals  Enc Vitals Group     BP 07/03/22 1704 116/71     Pulse Rate 07/03/22 1704 63     Resp 07/03/22 1704 16     Temp 07/03/22 1704 98.6 F (37 C)     Temp Source 07/03/22 1704 Oral     SpO2 07/03/22 1704 98 %     Weight 07/03/22 1703 170 lb (77.1 kg)     Height 07/03/22 1703 6\' 4"  (1.93 m)     Head Circumference --      Peak Flow --      Pain Score --      Pain Loc --      Pain Edu? --      Excl. in GC? --    No data found.  Updated Vital Signs BP 116/71 (BP Location: Left Arm)   Pulse 63   Temp 98.6 F (37 C) (Oral)   Resp 16   Ht 6\' 4"  (1.93 m)   Wt 170 lb (77.1 kg)   SpO2 98%  BMI 20.69 kg/m   Visual Acuity Right Eye Distance:   Left Eye Distance:   Bilateral Distance:    Right Eye Near:   Left Eye Near:    Bilateral Near:     Physical Exam Vitals and nursing note reviewed.  Constitutional:      Appearance: Normal appearance. He is not ill-appearing.  HENT:     Head: Normocephalic and atraumatic.  Musculoskeletal:        General: Tenderness present. No swelling, deformity or signs of injury. Normal range of motion.  Skin:    General: Skin is warm and dry.     Capillary Refill: Capillary refill takes less than 2 seconds.  Neurological:     General: No focal deficit present.     Mental Status: He is alert and oriented to person, place, and time.      UC Treatments / Results  Labs (all labs ordered are listed, but only abnormal results are displayed) Labs Reviewed - No data to display  EKG   Radiology No results found.  Procedures Procedures (including critical care time)  Medications Ordered in UC Medications - No data to display  Initial Impression / Assessment and Plan / UC Course  I have  reviewed the triage vital signs and the nursing notes.  Pertinent labs & imaging results that were available during my care of the patient were reviewed by me and considered in my medical decision making (see chart for details).   Patient is a pleasant, nontoxic-appearing 22 year old male presenting for evaluation of 1 year worth of right knee pain as outlined in HPI above.  On exam patient's knee normal anatomical alignment and there is no pain with palpation of the patella, tibial tuberosity, patellar tendon, quadriceps complex, or popliteal space.  He does have some mild tenderness with palpation of the medial and lateral joint line.  Anterior posterior drawer negative.  When placed in the joint through passive range of motion he does experience clicking underneath his patella.  It is unclear if this is chondromalacia versus meniscus tear.  I have advised the patient that he needs to see an orthopedic specialist for more dynamic imaging to look at the soft tissue structures.  At his last visit he was prescribed meloxicam and a patellar brace.  He still has a patellar brace and I encouraged him to resume using that and I will represcribe meloxicam 7.5 mg that he can take daily.  He should follow-up with [facility] as was recommended at his last visit.  Work note provided.   Final Clinical Impressions(s) / UC Diagnoses   Final diagnoses:  Acute pain of right knee     Discharge Instructions      You may have a tear in your meniscus and you need to see an orthopedist for further evaluation and treatment.  Wear your patellar brace to provide support and aid in pain relief.  Take the Mobic once daily to help with pain and inflammation.     ED Prescriptions     Medication Sig Dispense Auth. Provider   meloxicam (MOBIC) 7.5 MG tablet Take 1 tablet (7.5 mg total) by mouth daily. 30 tablet Becky Augusta, NP      PDMP not reviewed this encounter.   Becky Augusta, NP 07/03/22 1744

## 2022-07-03 NOTE — ED Triage Notes (Signed)
Pt c/o R knee pain x1 yr. States he was told that he could have a poss meniscus tear & had fluid in his knee was seen on 04/14/21 for the same issue. States he heard a pop yesterday going down the stairs has taken tylenol w/o relief.

## 2022-07-03 NOTE — Discharge Instructions (Signed)
You may have a tear in your meniscus and you need to see an orthopedist for further evaluation and treatment.  Wear your patellar brace to provide support and aid in pain relief.  Take the Mobic once daily to help with pain and inflammation.

## 2022-09-07 IMAGING — CR DG SHOULDER 2+V*L*
4 series · 4 of 4 positions shown · non-contrast
Comparison: None.

CLINICAL DATA: Acute left shoulder pain after playing basketball.

EXAM:
LEFT SHOULDER - 2+ VIEW

[shoulder grashey]
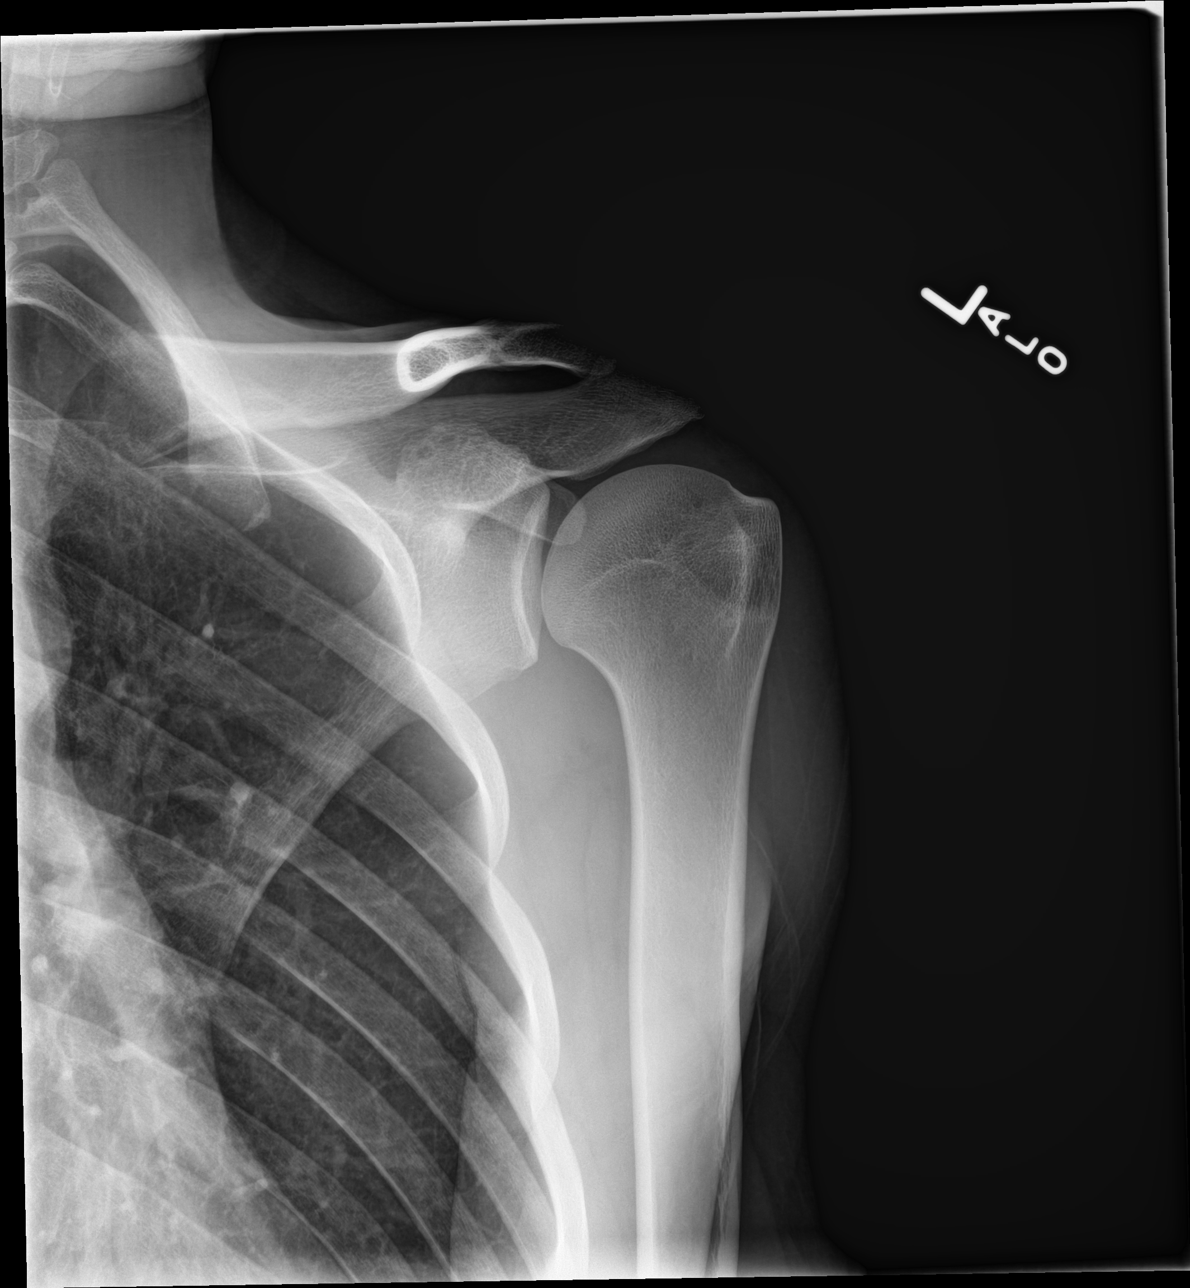

[shoulder axial (1 of 2)]
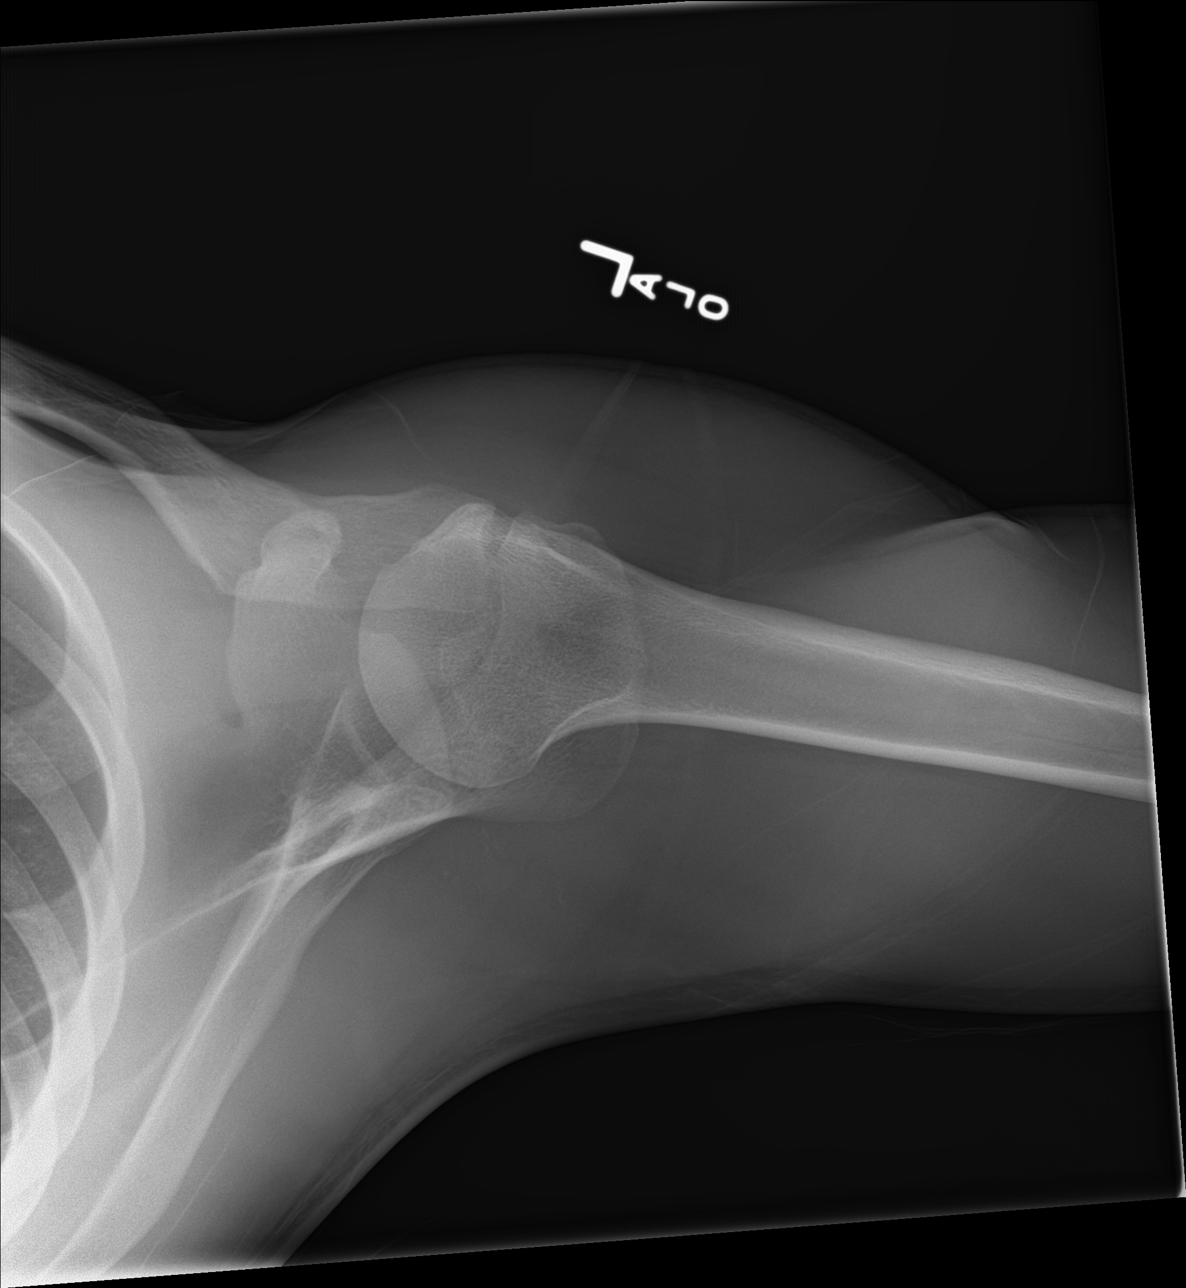

[shoulder axial (2 of 2)]
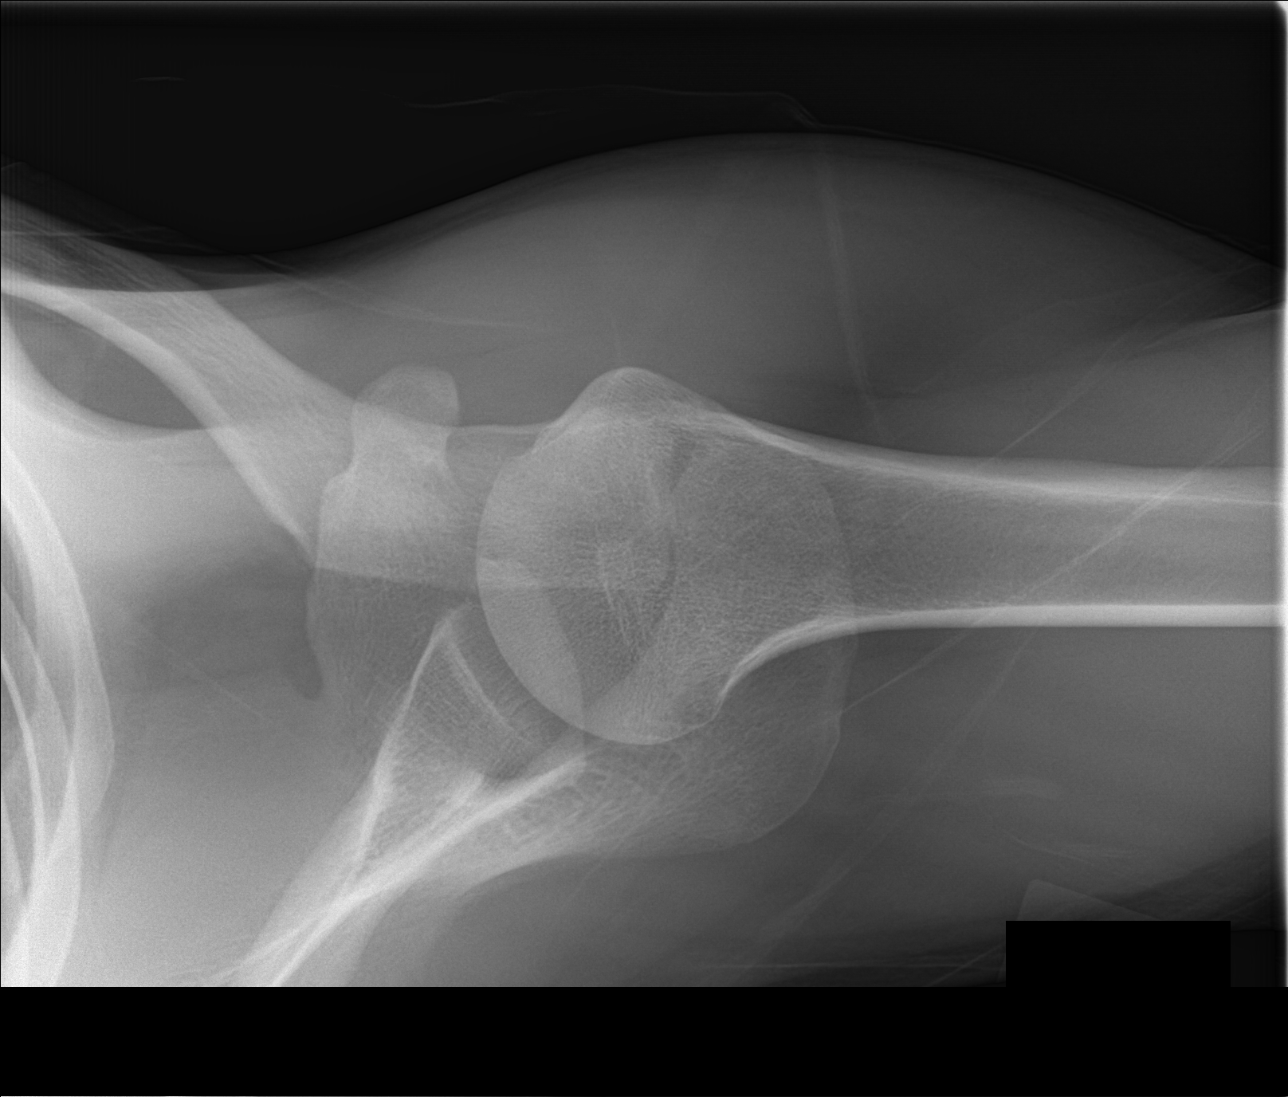

[shoulder y view]
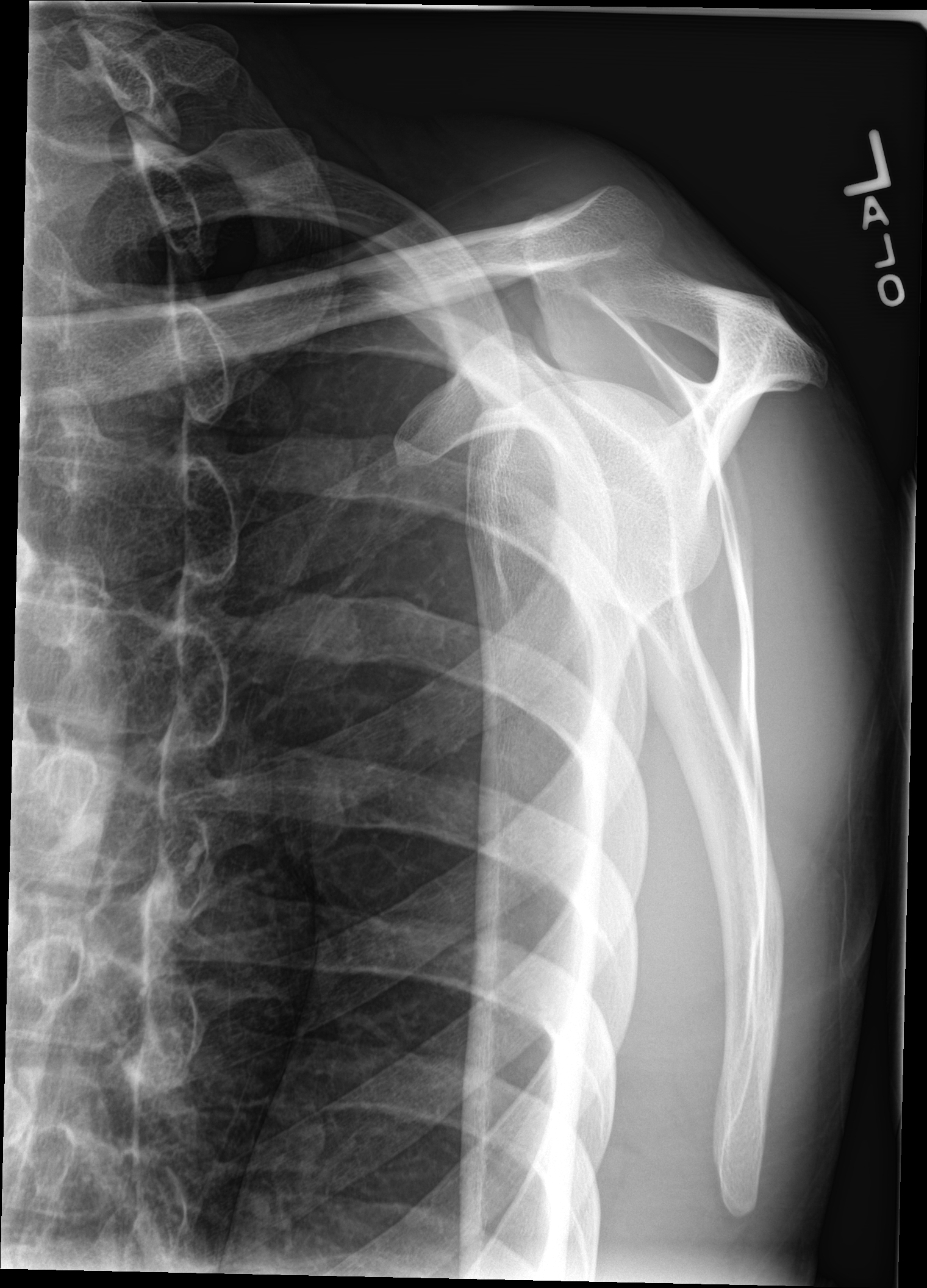

[4 of 4 positions shown; findings below may reference images not displayed]

FINDINGS: There is no evidence of fracture or dislocation. There is no
evidence of arthropathy or other focal bone abnormality. Soft
tissues are unremarkable.
IMPRESSION: Negative.

## 2022-09-12 ENCOUNTER — Emergency Department: Payer: Medicaid Other

## 2022-09-12 ENCOUNTER — Emergency Department
Admission: EM | Admit: 2022-09-12 | Discharge: 2022-09-12 | Disposition: A | Discharge status: Home / Self Care | Payer: Medicaid Other | Source: Home / Self Care | Attending: Emergency Medicine | Admitting: Emergency Medicine

## 2022-09-12 ENCOUNTER — Other Ambulatory Visit: Payer: Self-pay

## 2022-09-12 DIAGNOSIS — M545 Low back pain, unspecified: Secondary | ICD-10-CM | POA: Diagnosis not present

## 2022-09-12 DIAGNOSIS — M25561 Pain in right knee: Secondary | ICD-10-CM | POA: Insufficient documentation

## 2022-09-12 HISTORY — DX: Scoliosis, unspecified: M41.9

## 2022-09-12 MED ORDER — METHOCARBAMOL 500 MG PO TABS: 500.0000 mg | ORAL_TABLET | Freq: Four times a day (QID) | ORAL | 0 refills | Status: AC

## 2022-09-12 MED ORDER — NAPROXEN 500 MG PO TABS: 500.0000 mg | ORAL_TABLET | Freq: Two times a day (BID) | ORAL | 0 refills | Status: AC

## 2022-09-12 NOTE — Discharge Instructions (Signed)
Do not take the methocarbamol if you are at work.  Follow up with orthopedics or primary care if not improving over the next few days.

## 2022-09-12 NOTE — ED Notes (Signed)
See triage note  Presents with lower back pain and right knee pain  States he developed right knee pain and lower leg spasms yesterday  No swelling noted  ambulates well to treatment room

## 2022-09-12 NOTE — ED Triage Notes (Signed)
Pt presents to ER with c/o right knee pain that started yesterday night after getting off work.  Pt denies any injuries to his right knee, but states that it feels like it is "locking up."  Pt also states pain radiates down right leg into calf area.  Pt ambulatory to triage otherwise and in NAD.  No swelling/redness noted to right leg.

## 2022-09-12 NOTE — ED Provider Notes (Signed)
[facility] Provider Note    Event Date/Time   First MD Initiated Contact with Patient 09/12/22 [phone]     (approximate)   History   Knee Pain   HPI  Don Douglas is a 22 y.o. male  with history of chronic right knee pain, reported scoliosis, anxiety and as listed in EMR presents to the emergency department for treatment and evaluation of low back pain and right knee pain. 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 Last night, he started having spasms in his right knee and dull achy pain in his lower back. Some relief with muscle relaxer.       Physical Exam   Triage Vital Signs: ED Triage Vitals  Enc Vitals Group     BP 09/12/22 0636 128/80     Pulse Rate 09/12/22 0636 61     Resp 09/12/22 0636 18     Temp 09/12/22 0636 98.2 F (36.8 C)     Temp src --      SpO2 09/12/22 0636 100 %     Weight 09/12/22 0640 180 lb (81.6 kg)     Height 09/12/22 0640 6\' 4"  (1.93 m)     Head Circumference --      Peak Flow --      Pain Score 09/12/22 0639 7     Pain Loc --      Pain Edu? --      Excl. in GC? --     Most recent vital signs: Vitals:   09/12/22 0636  BP: 128/80  Pulse: 61  Resp: 18  Temp: 98.2 F (36.8 C)  SpO2: 100%    General: Awake, no distress.  CV:  Good peripheral perfusion.  Resp:  Normal effort.  Abd:  No distention.  Other:  Right knee joint stable, no edema or open wounds.   ED Results / Procedures / Treatments   Labs (all labs ordered are listed, but only abnormal results are displayed) Labs Reviewed - No data to display   EKG     RADIOLOGY  Image and radiology report reviewed and interpreted by me. Radiology report consistent with the same.  Lumbar spine and right knee images are without acute concerns.  PROCEDURES:  Critical Care performed: No  Procedures   MEDICATIONS ORDERED IN ED:  Medications - No data to display   IMPRESSION / MDM /  ASSESSMENT AND PLAN / ED COURSE   I have reviewed the triage note.  Differential diagnosis includes, but is not limited to, acute on chronic right knee pain, low back strain.  Patient's presentation is most consistent with acute complicated illness / injury requiring diagnostic workup.  22 year old male presents to the emergency department for treatment and evaluation of low back and right knee pain.   Plan will be to get imaging.  Imaging all negative. Results discussed with the patient. Robaxin and Naprosyn prescriptions sent to his pharmacy and work note provided for today.      FINAL CLINICAL IMPRESSION(S) / ED DIAGNOSES   Final diagnoses:  Acute bilateral low back pain without sciatica  Acute pain of right knee     Rx / DC Orders   ED Discharge Orders          Ordered    naproxen (NAPROSYN) 500 MG tablet  2 times daily with meals        09/12/22 0828    methocarbamol (  ROBAXIN) 500 MG tablet  4 times daily        09/12/22 1610             Note:  This document was prepared using Dragon voice recognition software and may include unintentional dictation errors.   Chinita Pester, FNP 09/12/22 [id number]    Chesley Noon, MD 09/12/22 [phone]
